# Patient Record
Sex: Female | Born: 1980 | Race: White | Hispanic: No | Marital: Single | Smoking: Current every day smoker
Health system: Southern US, Community
[De-identification: ages and names within clinical notes are randomized; demographics above are authoritative.]

## PROBLEM LIST (undated history)

## (undated) DIAGNOSIS — F319 Bipolar disorder, unspecified: Secondary | ICD-10-CM

## (undated) DIAGNOSIS — M810 Age-related osteoporosis without current pathological fracture: Secondary | ICD-10-CM

## (undated) DIAGNOSIS — N809 Endometriosis, unspecified: Secondary | ICD-10-CM

## (undated) DIAGNOSIS — R51 Headache: Secondary | ICD-10-CM

## (undated) DIAGNOSIS — I341 Nonrheumatic mitral (valve) prolapse: Secondary | ICD-10-CM

## (undated) DIAGNOSIS — J342 Deviated nasal septum: Secondary | ICD-10-CM

## (undated) DIAGNOSIS — R519 Headache, unspecified: Secondary | ICD-10-CM

## (undated) DIAGNOSIS — F840 Autistic disorder: Secondary | ICD-10-CM

## (undated) DIAGNOSIS — H6980 Other specified disorders of Eustachian tube, unspecified ear: Secondary | ICD-10-CM

## (undated) DIAGNOSIS — N3010 Interstitial cystitis (chronic) without hematuria: Secondary | ICD-10-CM

## (undated) DIAGNOSIS — S0300XA Dislocation of jaw, unspecified side, initial encounter: Secondary | ICD-10-CM

## (undated) DIAGNOSIS — H6990 Unspecified Eustachian tube disorder, unspecified ear: Secondary | ICD-10-CM

## (undated) DIAGNOSIS — J310 Chronic rhinitis: Secondary | ICD-10-CM

## (undated) DIAGNOSIS — F419 Anxiety disorder, unspecified: Secondary | ICD-10-CM

## (undated) HISTORY — DX: Chronic rhinitis: J31.0

## (undated) HISTORY — DX: Dislocation of jaw, unspecified side, initial encounter: S03.00XA

## (undated) HISTORY — DX: Headache: R51

## (undated) HISTORY — PX: CHOLECYSTECTOMY: SHX55

## (undated) HISTORY — DX: Headache, unspecified: R51.9

## (undated) HISTORY — DX: Autistic disorder: F84.0

## (undated) HISTORY — DX: Anxiety disorder, unspecified: F41.9

## (undated) HISTORY — DX: Bipolar disorder, unspecified: F31.9

## (undated) HISTORY — DX: Other specified disorders of Eustachian tube, unspecified ear: H69.80

## (undated) HISTORY — PX: WISDOM TOOTH EXTRACTION: SHX21

## (undated) HISTORY — DX: Nonrheumatic mitral (valve) prolapse: I34.1

## (undated) HISTORY — DX: Deviated nasal septum: J34.2

## (undated) HISTORY — PX: LAPAROSCOPIC ABDOMINAL EXPLORATION: SHX6249

## (undated) HISTORY — DX: Endometriosis, unspecified: N80.9

## (undated) HISTORY — DX: Unspecified eustachian tube disorder, unspecified ear: H69.90

---

## 2012-08-05 DIAGNOSIS — IMO0002 Reserved for concepts with insufficient information to code with codable children: Secondary | ICD-10-CM | POA: Insufficient documentation

## 2012-08-05 DIAGNOSIS — N803 Endometriosis of pelvic peritoneum: Secondary | ICD-10-CM | POA: Insufficient documentation

## 2014-02-12 DIAGNOSIS — R259 Unspecified abnormal involuntary movements: Secondary | ICD-10-CM | POA: Insufficient documentation

## 2014-02-12 DIAGNOSIS — F09 Unspecified mental disorder due to known physiological condition: Secondary | ICD-10-CM | POA: Insufficient documentation

## 2014-02-12 DIAGNOSIS — G43009 Migraine without aura, not intractable, without status migrainosus: Secondary | ICD-10-CM | POA: Insufficient documentation

## 2014-03-19 DIAGNOSIS — R6889 Other general symptoms and signs: Secondary | ICD-10-CM | POA: Insufficient documentation

## 2014-03-19 DIAGNOSIS — G479 Sleep disorder, unspecified: Secondary | ICD-10-CM | POA: Insufficient documentation

## 2014-03-19 DIAGNOSIS — F329 Major depressive disorder, single episode, unspecified: Secondary | ICD-10-CM | POA: Insufficient documentation

## 2014-03-19 DIAGNOSIS — R519 Headache, unspecified: Secondary | ICD-10-CM | POA: Insufficient documentation

## 2014-03-19 DIAGNOSIS — R413 Other amnesia: Secondary | ICD-10-CM | POA: Insufficient documentation

## 2014-03-19 DIAGNOSIS — K929 Disease of digestive system, unspecified: Secondary | ICD-10-CM | POA: Insufficient documentation

## 2014-03-19 DIAGNOSIS — F32A Depression, unspecified: Secondary | ICD-10-CM | POA: Insufficient documentation

## 2014-03-19 DIAGNOSIS — R51 Headache: Secondary | ICD-10-CM

## 2015-05-04 ENCOUNTER — Ambulatory Visit (INDEPENDENT_AMBULATORY_CARE_PROVIDER_SITE_OTHER): Payer: BLUE CROSS/BLUE SHIELD | Admitting: Neurology

## 2015-05-04 ENCOUNTER — Encounter: Payer: Self-pay | Admitting: Neurology

## 2015-05-04 VITALS — BP 96/52 | HR 52 | Resp 12 | Ht 61.0 in | Wt 105.0 lb

## 2015-05-04 DIAGNOSIS — R251 Tremor, unspecified: Secondary | ICD-10-CM

## 2015-05-04 DIAGNOSIS — F39 Unspecified mood [affective] disorder: Secondary | ICD-10-CM | POA: Diagnosis not present

## 2015-05-04 DIAGNOSIS — G43019 Migraine without aura, intractable, without status migrainosus: Secondary | ICD-10-CM

## 2015-05-04 MED ORDER — GABAPENTIN 100 MG PO CAPS: ORAL_CAPSULE | ORAL | Status: AC

## 2015-05-04 MED ORDER — ONDANSETRON HCL 4 MG PO TABS: 4.0000 mg | ORAL_TABLET | Freq: Three times a day (TID) | ORAL | Status: DC | PRN

## 2015-05-04 NOTE — Patient Instructions (Signed)
Please remember, common headache triggers are: sleep deprivation, dehydration, overheating, stress, hypoglycemia or skipping meals and blood sugar fluctuations, excessive pain medications or excessive alcohol use or caffeine withdrawal. Some people have food triggers such as aged cheese, orange juice or chocolate, especially dark chocolate, or MSG (monosodium glutamate). Try to avoid these headache triggers as much possible. It may be helpful to keep a headache diary to figure out what makes your headaches worse or brings them on and what alleviates them. Some people report headache onset after exercise but studies have shown that regular exercise may actually prevent headaches from coming. If you have exercise-induced headaches, please make sure that you drink plenty of fluid before and after exercising and that you do not over do it and do not overheat.  I would like for you try something on a daily basis to help prevent migraines from coming: Neurontin (gabapentin) 100 mg strength: Take 1 pill nightly at bedtime for 1 week, then 2 pills nightly for 1 week, then 3 pills each night thereafter. The most common side effects reported are sedation or sleepiness. Rare side effects include balance problems, confusion.   We will avoid triptans, such as sumatriptan or Relpax.   I will recommend, that Dr. Sudie BaileyProchnau reduce your metoprolol, due to low pulse and low blood pressure.   I will prescribe an as needed medication for nausea: Zofran (ondansetron), please be aware, it can make you sleepy or drowsy!

## 2015-05-04 NOTE — Progress Notes (Signed)
Subjective:    [name] ID: Kara [name] is a 34 y.o. female.  HPI     Kara Foley, MD, PhD [facility] [address] [address] [place], [place] [address]  Dear Dr. Sudie Bailey,   I saw your [name], Kara [name], upon your kind request in my neurologic clinic today for initial consultation of Kara [name] recurrent headaches, concern for migraines. Kara [name] is unaccompanied today. As you know, Kara [name] is a 34 year old right-handed woman with an underlying medical history of autism spectrum disorder, anxiety, bipolar disorder, mitral valve prolapse, tremors, and chronic rhinitis, who has had migraines for years, since she was a teenager.  She has previously been seeing Dr. Adella Hare in neurology. She is currently on clonazepam 1 mg twice daily, Relpax 40 mg as needed, MiraLAX twice daily, once daily multivitamin, fish well, Prozac 40 mg once daily, metoprolol 25-50 mg once daily, lithium 1200 mg twice daily, vitamin B6 twice daily and probiotic capsule once daily. She had a head CT without contrast on 12/28/2013: Study was reported as normal. This was done at [facility]. She had MRI with and without contrast on 02/18/2014 also at [facility] which showed nonspecific periventricular white matter signal abnormality, not clearly characteristic for any one disease process. 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 I reviewed your office note from  03/30/2015. She has been on metoprolol 50 mg once daily. She occasionally feels lightheaded. In Kara past, she may have tried Topamax but is not sure. She is not sure if she tried amitriptyline. For abortive treatment she tried sumatriptan but had side effects including chest tightness. More recently she tried Relpax last month felt Kara [name] migraines forgetting more frequent. She typically has a few migraine headaches per month. In Kara past 2 years she has experienced prolonged migraines  as well including migraines lasting for 4 days. She does not have a family history of migraines. She sees a Therapist, sports in [employer] as well as a Veterinary surgeon. 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 She does not work. She lives with Kara [name] parents. She has no children. Kara headache typically starts on Kara left side and is described as throbbing. She also has pain behind Kara left eye and this travels to Kara right eye sometimes. She has been having more associated nausea recently. She does get photophobia with Kara [name] headaches.  Kara [name] Past Medical History Is Significant For: Past Medical History  Diagnosis Date  . Mitral valve prolapse   . TMJ (dislocation of temporomandibular joint)   . Eustachian tube dysfunction   . Headache   . Chronic rhinitis   . Anxiety   . Autism spectrum disorder   . Bipolar disorder   . Deviated septum   . Endometriosis     Kara [name] Past Surgical History Is Significant For: Past Surgical History  Procedure Laterality Date  . Cholecystectomy    . Wisdom tooth extraction    . Laparoscopic abdominal exploration      Kara [name] Family History Is Significant For: Family History  Problem Relation Age of Onset  . Hypothyroidism Mother   . GER disease Father   . OCD Father   . Brain cancer Maternal Grandmother   . Hypertension Maternal Grandfather   . Stroke Maternal Grandfather   . Lung cancer Paternal Grandfather     Kara [name] Social History Is Significant For: History   Social History  . Marital Status: Single    Spouse Name:  N/A  . Number of Children: 0  . Years of Education: BA   Occupational History  . N/A    Social History Main Topics  . Smoking status: Former Games developer  . Smokeless tobacco: Not on file     Comment: Quit 2008  . Alcohol Use: No     Comment: Quit 5 years ago   . Drug Use: No     Comment: Quit over 7 yrs ago   . Sexual Activity: Not on file   Other Topics Concern  . None   Social History Narrative   Drinks 1 cup of tea in Kara  morning     Kara [name] Allergies Are:  Allergies  Allergen Reactions  . Sulfa Antibiotics   :   Kara [name] Current Medications Are:  Outpatient Encounter Prescriptions as of 05/04/2015  Medication Sig  . clonazePAM (KLONOPIN) 1 MG tablet Take 1 mg by mouth 2 (two) times daily.  Marland Kitchen FLUoxetine (PROZAC) 40 MG capsule Take 40 mg by mouth daily.  Marland Kitchen LITHIUM PO Take 1,200 mg by mouth 2 (two) times daily.  . metoprolol (LOPRESSOR) 50 MG tablet Take 50 mg by mouth 2 (two) times daily.  . norethindrone-ethinyl estradiol (MICROGESTIN,JUNEL,LOESTRIN) 1-20 MG-MCG tablet Take 1 tablet by mouth daily.  Marland Kitchen gabapentin (NEURONTIN) 100 MG capsule Take 1 pill nightly at bedtime for 1 week, then 2 pills nightly for 1 week, then 3 pills each night thereafter.  . ondansetron (ZOFRAN) 4 MG tablet Take 1 tablet (4 mg total) by mouth every 8 (eight) hours as needed for nausea or vomiting.  . [DISCONTINUED] ammonium lactate (AMLACTIN) 12 % cream Apply topically as needed for dry skin.  . [DISCONTINUED] clobetasol cream (TEMOVATE) 0.05 % Apply 1 application topically 2 (two) times daily.  . [DISCONTINUED] eletriptan (RELPAX) 40 MG tablet Take 40 mg by mouth as needed for migraine or headache. May repeat in 2 hours if headache persists or recurs.  . [DISCONTINUED] Multiple Vitamin (MULTIVITAMIN) tablet Take 1 tablet by mouth daily.  . [DISCONTINUED] Omega-3 Fatty Acids (FISH OIL) 1000 MG CAPS Take by mouth.  . [DISCONTINUED] pramipexole (MIRAPEX) 1 MG tablet Take 1 mg by mouth 3 (three) times daily.  . [DISCONTINUED] Probiotic Product (PROBIOTIC DAILY PO) Take by mouth.  . [DISCONTINUED] Pyridoxine HCl (VITAMIN B-6) 250 MG tablet Take 250 mg by mouth daily.   No facility-administered encounter medications on file as of 05/04/2015.  :  Review of Systems:  Out of a complete 14 point review of systems, all are reviewed and negative with Kara exception of these symptoms as listed below:   Review of Systems  HENT: Positive for  rhinorrhea and tinnitus.   Allergic/Immunologic: Positive for environmental allergies.  Neurological: Positive for tremors and headaches.       Short term memory loss, daytime sleepiness, wakes up during Kara night, wakes up in Kara morning feeling tired, takes naps during Kara day.   First migraine was in elementary school, last month had 10 migraines, this month she has had none. Migraines can last 4 days in a row.   Psychiatric/Behavioral:       Depression, anxiety, too much sleep, disinterest in activities    Objective:  Neurologic Exam  Physical Exam Physical Examination:   Filed Vitals:   05/04/15 1426  BP: 96/52  Pulse: 52  Resp: 12   General Examination: Kara [name] is a very pleasant 34 y.o. female in no acute distress. She is thin and underweight appearing. She is well groomed. She  feels mildly lightheaded. She is mildly anxious appearing.  HEENT: Normocephalic, atraumatic, pupils are equal, round and reactive to light and accommodation. Funduscopic exam is normal with sharp disc margins noted. Extraocular tracking is good without limitation to gaze excursion or nystagmus noted. Normal smooth pursuit is noted. Hearing is grossly intact. Tympanic membranes are clear bilaterally. Face is symmetric with normal facial animation and normal facial sensation. Speech is clear with no dysarthria noted. There is no hypophonia. There is no lip, neck/head, jaw or voice tremor. Neck is supple with full range of passive and active motion. There are no carotid bruits on auscultation. Oropharynx exam reveals: moderate mouth dryness, adequate dental hygiene and mild airway crowding, due to narrow airway entry. Mallampati is class I. Tongue protrudes centrally and palate elevates symmetrically. Tonsils are small. Neck size is slender.   Chest: Clear to auscultation without wheezing, rhonchi or crackles noted.  Heart: S1+S2+0, regular and normal without murmurs, rubs or gallops noted.   Abdomen:  Soft, non-tender and non-distended with normal bowel sounds appreciated on auscultation.  Extremities: There is no pitting edema in Kara distal lower extremities bilaterally. Pedal pulses are intact.  Skin: Warm and dry without trophic changes noted. There are no varicose veins.  Musculoskeletal: exam reveals no obvious joint deformities, tenderness or joint swelling or erythema.   Neurologically:  Mental status: Kara [name] is awake, alert and oriented in all 4 spheres. Kara [name] immediate and remote memory, attention, language skills and fund of knowledge are appropriate. There is evidence of aphasia, agnosia, apraxia or anomia. Speech is clear with normal prosody and enunciation. Thought process is linear. Mood is constricted and decreased range and affect is blunted.  Cranial nerves II - XII are as described above under HEENT exam. In addition: shoulder shrug is normal with equal shoulder height noted. Motor exam: Normal bulk, strength and tone is noted. There is no drift, or rebound. She has a mild lateral upper extremity postural and action tremor. Kara tremor frequency is fairly fast and amplitude is low. She has a slight resting tremor which is intermittent in both upper extremities. Romberg is negative. Reflexes are 2+ throughout. Babinski: Toes are flexor bilaterally. Fine motor skills and coordination: intact with normal finger taps, normal hand movements, normal rapid alternating patting, normal foot taps and normal foot agility.  Cerebellar testing: No dysmetria or intention tremor on finger to nose testing. Heel to shin is unremarkable bilaterally. There is no truncal or gait ataxia.  Sensory exam: intact to light touch, pinprick, vibration, temperature sense in Kara upper and lower extremities.  Gait, station and balance: She stands easily. No veering to one side is noted. No leaning to one side is noted. Posture is age-appropriate and stance is narrow based. Gait shows normal stride length and  normal pace. No problems turning are noted. She turns en bloc. Tandem walk is slightly difficult for Kara [name].  Assessment and Plan:   In summary, Kara [name] is a very pleasant 34 y.o.-year old female with an underlying medical history of autism spectrum disorder, anxiety, bipolar disorder, mitral valve prolapse, tremors, and chronic rhinitis, who has had migraines for years, since she was a teenager. For abortive treatment she tried 2 different triptans had side effects. For preventative treatment she has been on metoprolol but Kara [name] blood pressure and pulse a low today. In fact, I would recommend that Kara [name] metoprolol be reduced from 50 to 25 mg. For symptomatic treatment of Kara [name] nausea I suggested trial of Zofran. I talked  Kara [name] about potential side effects including sedation. She has evidence of mild upper extremity tremors, most likely secondary to Kara [name] lithium. She is currently on multiple psych a tropic medications. I talked Kara [name] about preventative migraine treatment as well. Kara challenges that she is already on sedating medications, she is already on an anti-suppressant, she may have tried Topamax in Kara past but Kara [name] weight is too low for me to consider this at this time. A beta blocker has been tried and has reduced Kara [name] blood pressure and pulse. A calcium channel blocker may have a similar effect. Today I suggested we try Kara [name] on low-dose gabapentin, starting at 100 mg strength with gradual titration. I asked Kara [name] to started at 100 mg each night for 1 week and increase it in 100 mg increments every week to up to 300 mg each night. I talked to Kara [name] about potential side effects. I provided Kara [name] with a new prescription and instructions. I also provided Kara [name] with a prescription for Zofran 4 mg for as needed use. We will try to avoid a triptan at this time.  I advised Kara [name] about common headache triggers: sleep deprivation, dehydration, overheating, stress, hypoglycemia or skipping meals and blood sugar  fluctuations, excessive pain medications or excessive alcohol use or caffeine withdrawal. Some people have food triggers such as aged cheese, orange juice or chocolate, especially dark chocolate, or MSG (monosodium glutamate). She is to try to avoid these headache triggers as much possible. It may be helpful to keep a headache diary to figure out what makes Kara [name] headaches worse or brings them on and what alleviates them. Some people report headache onset after exercise but studies have shown that regular exercise may actually prevent headaches from coming. If She has exercise-induced headaches, She is advised to drink plenty of fluid before and after exercising and that to not overdo it and to not overheat.  As far as further diagnostic testing is concerned, I suggested no new test today. She had a head CT as well as a brain MRI in 2015 which I reviewed Kara report. Kara [name] physical exam is otherwise nonfocal. She is reassured in that regard. I will see Kara [name] back routinely in 3-4 months, sooner if Kara need arises. I answered all Kara [name] questions today and Kara [name] was in agreement. I encouraged Kara [name] to call or email with any interim questions or concerns.  Thank you very much for allowing me to participate in Kara care of this nice [name]. If I can be of any further assistance to you please do not hesitate to call me at [phone].  Sincerely,   Kara Foley, MD, PhD

## 2015-08-30 ENCOUNTER — Other Ambulatory Visit: Payer: Self-pay

## 2015-08-30 DIAGNOSIS — G43019 Migraine without aura, intractable, without status migrainosus: Secondary | ICD-10-CM

## 2015-08-30 MED ORDER — ONDANSETRON HCL 4 MG PO TABS: 4.0000 mg | ORAL_TABLET | Freq: Three times a day (TID) | ORAL | Status: AC | PRN

## 2015-09-05 ENCOUNTER — Ambulatory Visit: Payer: Self-pay | Admitting: Neurology

## 2015-09-11 DIAGNOSIS — R358 Other polyuria: Secondary | ICD-10-CM | POA: Insufficient documentation

## 2015-09-11 DIAGNOSIS — F3181 Bipolar II disorder: Secondary | ICD-10-CM | POA: Insufficient documentation

## 2015-09-11 DIAGNOSIS — R3589 Other polyuria: Secondary | ICD-10-CM | POA: Insufficient documentation

## 2015-09-11 DIAGNOSIS — G723 Periodic paralysis: Secondary | ICD-10-CM | POA: Insufficient documentation

## 2015-09-11 DIAGNOSIS — T43595A Adverse effect of other antipsychotics and neuroleptics, initial encounter: Secondary | ICD-10-CM | POA: Insufficient documentation

## 2015-09-21 ENCOUNTER — Telehealth: Payer: Self-pay | Admitting: Neurology

## 2015-09-21 NOTE — Telephone Encounter (Signed)
Pt's mother called stating pt was hospitalized a few wks ago and the gabapentin was discontinued. She was diagnosed with nephrogenic diabetes and insipidus lithium induced. Mother states pt was started on gabapentin week from PCP and PCP is thinking pt should wait to see Dr Frances FurbishAthar until she has been on medication for awhile. Mother will call back in a few months to r/s appt.

## 2015-09-21 NOTE — Telephone Encounter (Signed)
FYI

## 2015-09-27 DIAGNOSIS — E871 Hypo-osmolality and hyponatremia: Secondary | ICD-10-CM | POA: Insufficient documentation

## 2015-10-03 ENCOUNTER — Ambulatory Visit: Payer: BLUE CROSS/BLUE SHIELD | Admitting: Neurology

## 2015-11-17 DIAGNOSIS — M419 Scoliosis, unspecified: Secondary | ICD-10-CM | POA: Insufficient documentation

## 2015-11-17 DIAGNOSIS — F840 Autistic disorder: Secondary | ICD-10-CM | POA: Insufficient documentation

## 2015-11-17 DIAGNOSIS — F411 Generalized anxiety disorder: Secondary | ICD-10-CM | POA: Insufficient documentation

## 2015-11-17 DIAGNOSIS — F3181 Bipolar II disorder: Secondary | ICD-10-CM | POA: Insufficient documentation

## 2016-01-26 DIAGNOSIS — Z0279 Encounter for issue of other medical certificate: Secondary | ICD-10-CM

## 2016-05-23 ENCOUNTER — Ambulatory Visit (INDEPENDENT_AMBULATORY_CARE_PROVIDER_SITE_OTHER): Payer: BLUE CROSS/BLUE SHIELD | Admitting: Sports Medicine

## 2016-05-23 ENCOUNTER — Encounter: Payer: Self-pay | Admitting: Sports Medicine

## 2016-05-23 DIAGNOSIS — L03032 Cellulitis of left toe: Secondary | ICD-10-CM

## 2016-05-23 DIAGNOSIS — L03012 Cellulitis of left finger: Secondary | ICD-10-CM

## 2016-05-23 DIAGNOSIS — L603 Nail dystrophy: Secondary | ICD-10-CM

## 2016-05-23 DIAGNOSIS — M79675 Pain in left toe(s): Secondary | ICD-10-CM | POA: Diagnosis not present

## 2016-05-23 NOTE — Patient Instructions (Signed)

## 2016-05-23 NOTE — Progress Notes (Signed)
Patient ID: Alis Sawchuk Vanhoesen, female   DOB: 04-Oct-1981, 35 y.o.   MRN: [id number] Subjective: Kara [name] is a 35 y.o. female patient presents to office today complaining of a painful incurvated, red, hot, swollen lateral nail border of the first toe on the left foot. This has been present for several years on and off. Patient has treated this by trimming, Epsom salt, and currently on antibiotics started by her primary care doctor and fluconazole for concerning of fungus/nail changes. Patient denies fever/chills/nausea/vomitting/any other related constitutional symptoms at this time.  Patient Active Problem List   Diagnosis Date Noted  . Autism spectrum 11/17/2015  . Bipolar II disorder, most recent episode major depressive (HCC) 11/17/2015  . Anxiety, generalized 11/17/2015  . Scoliosis 11/17/2015  . Hyponatremia with decreased serum osmolality 09/27/2015  . Bipolar II disorder (HCC) 09/11/2015  . Adynamia 09/11/2015  . Adverse effect of other antipsychotics and neuroleptics, initial encounter 09/11/2015  . Diuresis excessive 09/11/2015  . Clinical depression 03/19/2014  . Disease of stomach and intestines 03/19/2014  . Cephalalgia 03/19/2014  . Amnesia 03/19/2014  . Abnormal findings-skull or head 03/19/2014  . Difficulty sleeping 03/19/2014  . Abnormal involuntary movement 02/12/2014  . Atypical migraine 02/12/2014  . Mild cognitive disorder 02/12/2014  . Cul-de-sac Riley Lam') endometriosis 08/05/2012    Current Outpatient Prescriptions on File Prior to Visit  Medication Sig Dispense Refill  . clonazePAM (KLONOPIN) 1 MG tablet Take 1 mg by mouth 2 (two) times daily.    Marland Kitchen FLUoxetine (PROZAC) 40 MG capsule Take 40 mg by mouth daily.    Marland Kitchen gabapentin (NEURONTIN) 100 MG capsule Take 1 pill nightly at bedtime for 1 week, then 2 pills nightly for 1 week, then 3 pills each night thereafter. 90 capsule 5  . norethindrone-ethinyl estradiol (MICROGESTIN,JUNEL,LOESTRIN) 1-20 MG-MCG tablet  Take 1 tablet by mouth daily.    . ondansetron (ZOFRAN) 4 MG tablet Take 1 tablet (4 mg total) by mouth every 8 (eight) hours as needed for nausea or vomiting. 20 tablet 0   No current facility-administered medications on file prior to visit.    Allergies  Allergen Reactions  . Sulfa Antibiotics Hives  . Black Walnut Pollen Allergy Skin Test Other (See Comments)    Throat closes up    Objective:  There were no vitals filed for this visit.  General: Well developed, nourished, in no acute distress, alert and oriented x3   Dermatology: Skin is warm, dry and supple bilateral. Left hallux nail appears to be  severely incurvated with hyperkeratosis formation at the distal aspects of  the lateral nail border. There is significant thickening of the left great toenail. (+) Erythema. (-) Edema. (-) serosanguous drainage present. The remaining nails appear unremarkable, free from ingrowing or symptoms at this time. There are no open sores, lesions or other signs of infection present.  Vascular: Dorsalis Pedis artery and Posterior Tibial artery pedal pulses are 2/4 bilateral with immedate capillary fill time. Pedal hair growth present. No lower extremity edema.   Neruologic: Grossly intact via light touch bilateral.  Musculoskeletal: Tenderness to palpation of the left hallux lateral nail fold. Muscular strength within normal limits in all groups bilateral.   Assesement and Plan: Problem List Items Addressed This Visit    None    Visit Diagnoses    Paronychia, left    -  Primary    Relevant Medications    fluconazole (DIFLUCAN) 150 MG tablet    cephALEXin (KEFLEX) 500 MG capsule  Toe pain, left        Nail dystrophy           -Discussed treatment alternatives and plan of care; Explained permanent/temporary nail avulsion and post procedure course to patient.Patient opt for total temporary removal of left hallux nail.  - After a verbal consent, injected 3 ml of a 50:50 mixture of 2%  plain  lidocaine and 0.5% plain marcaine in a normal hallux block fashion. Next, a  betadine prep was performed. Anesthesia was tested and found to be appropriate.  The left hallux nail was then incised from the hyponychium to the epinychium. The nail was removed and cleared from the field. The area was curretted for any remaining nail or spicules then flushed with alcohol and dressed with antibiotic cream and a dry sterile dressing. -Patient was instructed to leave the dressing intact for today and begin soaking  in a weak solution of betadine and water tomorrow. Patient was instructed to  soak for 15 minutes each day and apply neosporin and a gauze or bandaid dressing each day. -Patient was instructed to monitor the toe for signs of infection and return to office if toe becomes red, hot or swollen. -Advised ice, elevation, and tylenol or motrin if needed for pain.  -Continue with Keflex until completed as given by PCP -Continue with fluconazole as given by primary care doctor. Advised patient that we will see how her new nail grows in over time. Patient may warrant further treatment for nail changes, Based on growth of new nail however,  Advised patient that must wait adequate time of 6 months to a year to determine how nail will grow in and to further determine if additional treatment is warranted. -Work note given: No work 3 days. 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  -Patient is to return in 1 week for follow up care/nail check or sooner if problems arise.  Asencion Islamitorya Cylas Falzone, DPM

## 2016-05-30 ENCOUNTER — Encounter: Payer: Self-pay | Admitting: Sports Medicine

## 2016-05-30 ENCOUNTER — Ambulatory Visit (INDEPENDENT_AMBULATORY_CARE_PROVIDER_SITE_OTHER): Payer: BLUE CROSS/BLUE SHIELD | Admitting: Sports Medicine

## 2016-05-30 DIAGNOSIS — Z9889 Other specified postprocedural states: Secondary | ICD-10-CM

## 2016-05-30 DIAGNOSIS — M79675 Pain in left toe(s): Secondary | ICD-10-CM

## 2016-05-30 NOTE — Progress Notes (Signed)
Patient ID: Kara PitterSusannah J [name], female   DOB: [birth year], 35 y.o.   MRN: [id number] Subjective: Kara [name] is a 35 y.o. female patient returns to office today for follow up evaluation after having Left Hallux total temporary nail avulsion performed on 05/23/2016 for paronychia and nail dystrophy. Patient has been soaking using betadine and applying topical antibiotic covered with bandaid daily. Patient has completed antibiotics with no problems and also finish her last dose of Diflucan which she was prescribed 12 weeks by her primary care doctor Patient denies fever/chills/nausea/vomitting/any other related constitutional symptoms at this time.  Patient Active Problem List   Diagnosis Date Noted  . Autism spectrum 11/17/2015  . Bipolar II disorder, most recent episode major depressive (HCC) 11/17/2015  . Anxiety, generalized 11/17/2015  . Scoliosis 11/17/2015  . Hyponatremia with decreased serum osmolality 09/27/2015  . Bipolar II disorder (HCC) 09/11/2015  . Adynamia 09/11/2015  . Adverse effect of other antipsychotics and neuroleptics, initial encounter 09/11/2015  . Diuresis excessive 09/11/2015  . Clinical depression 03/19/2014  . Disease of stomach and intestines 03/19/2014  . Cephalalgia 03/19/2014  . Amnesia 03/19/2014  . Abnormal findings-skull or head 03/19/2014  . Difficulty sleeping 03/19/2014  . Abnormal involuntary movement 02/12/2014  . Atypical migraine 02/12/2014  . Mild cognitive disorder 02/12/2014  . 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 ammonium lactate (LAC-HYDRIN) 12 % lotion apply to affected area twice a day FOR DRY SKIN  0  . cephALEXin (KEFLEX) 500 MG capsule take 1 capsule by mouth three times a day for 10 days  0  . clonazePAM (KLONOPIN) 0.5 MG tablet   0  . clonazePAM (KLONOPIN) 1 MG tablet Take 1 mg by mouth 2 (two) times daily.    . fluconazole  (DIFLUCAN) 150 MG tablet take 1 tablet by mouth ONCE A WEEK FOR 12 WEEKS  0  . FLUoxetine (PROZAC) 40 MG capsule Take 40 mg by mouth daily.    Marland Kitchen. gabapentin (NEURONTIN) 100 MG capsule Take 1 pill nightly at bedtime for 1 week, then 2 pills nightly for 1 week, then 3 pills each night thereafter. 90 capsule 5  . norethindrone-ethinyl estradiol (MICROGESTIN,JUNEL,LOESTRIN) 1-20 MG-MCG tablet Take 1 tablet by mouth daily.    . ondansetron (ZOFRAN) 4 MG tablet Take 1 tablet (4 mg total) by mouth every 8 (eight) hours as needed for nausea or vomiting. 20 tablet 0  . OXcarbazepine (TRILEPTAL) 150 MG tablet Take 300 mg by mouth at bedtime.  0   No current facility-administered medications on file prior to visit.    Allergies  Allergen Reactions  . Sulfa Antibiotics Hives  . Black Walnut Pollen Allergy Skin Test Other (See Comments)    Throat closes up    Objective:  General: Well developed, nourished, in no acute distress, alert and oriented x3   Dermatology: Skin is warm, dry and supple bilateral.Left hallux nail bed appears to be clean, dry, with mild granular tissue and surrounding eschar/scab. (-) Erythema. (-) Edema. (-) serosanguous drainage present. The remaining nails appear unremarkable at this time. There are no other lesions or other signs of infection present.  Neurovascular status: Intact. No lower extremity swelling; No pain with calf compression bilateral.  Musculoskeletal: Decreased tenderness to palpation of the left hallux. Muscular strength within normal limits bilateral.   Assesement and Plan: Problem List Items Addressed This Visit    None    Visit Diagnoses  S/P nail surgery    -  Primary    Toe pain, left          -Examined patient  -CleansedLeft hallux nail bed and gently scrubbed with peroxide and applied antibiotic cream covered with bandaid.  -Discussed plan of care with patient. -Patient to now begin soaking in a weak solution of Epsom salt and warm water.  Patient was instructed to soak for 15-20 minutes each day until the toe appears normal and there is no drainage, redness, tenderness, or swelling at the procedure site, and apply neosporin and a gauze or bandaid dressing each day as needed. May leave open to air at night. -Educated patient on long term care after nail surgery. -Patient was instructed to monitor the toe for reoccurrence and signs of infection; Patient advised to return to office or go to ER if toe becomes red, hot or swollen. -Patient is to return as needed or sooner if problems arise.  Asencion Islam, DPM

## 2019-05-24 ENCOUNTER — Emergency Department (HOSPITAL_COMMUNITY)
Admission: EM | Admit: 2019-05-24 | Discharge: 2019-05-24 | Disposition: A | Discharge status: Home / Self Care | Payer: BLUE CROSS/BLUE SHIELD | Attending: Emergency Medicine | Admitting: Emergency Medicine

## 2019-05-24 ENCOUNTER — Encounter (HOSPITAL_COMMUNITY): Payer: Self-pay | Admitting: Emergency Medicine

## 2019-05-24 ENCOUNTER — Other Ambulatory Visit: Payer: Self-pay

## 2019-05-24 ENCOUNTER — Emergency Department (HOSPITAL_COMMUNITY): Payer: BLUE CROSS/BLUE SHIELD

## 2019-05-24 DIAGNOSIS — Y999 Unspecified external cause status: Secondary | ICD-10-CM | POA: Diagnosis not present

## 2019-05-24 DIAGNOSIS — Z79899 Other long term (current) drug therapy: Secondary | ICD-10-CM | POA: Insufficient documentation

## 2019-05-24 DIAGNOSIS — F840 Autistic disorder: Secondary | ICD-10-CM | POA: Insufficient documentation

## 2019-05-24 DIAGNOSIS — F17210 Nicotine dependence, cigarettes, uncomplicated: Secondary | ICD-10-CM | POA: Diagnosis not present

## 2019-05-24 DIAGNOSIS — F0781 Postconcussional syndrome: Secondary | ICD-10-CM | POA: Diagnosis not present

## 2019-05-24 DIAGNOSIS — G44309 Post-traumatic headache, unspecified, not intractable: Secondary | ICD-10-CM | POA: Diagnosis present

## 2019-05-24 DIAGNOSIS — Y939 Activity, unspecified: Secondary | ICD-10-CM | POA: Diagnosis not present

## 2019-05-24 DIAGNOSIS — Y92410 Unspecified street and highway as the place of occurrence of the external cause: Secondary | ICD-10-CM | POA: Diagnosis not present

## 2019-05-24 HISTORY — DX: Age-related osteoporosis without current pathological fracture: M81.0

## 2019-05-24 HISTORY — DX: Interstitial cystitis (chronic) without hematuria: N30.10
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
## 2019-05-24 NOTE — ED Notes (Signed)
Attempted to e sign and signature pad not working.
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
## 2019-10-30 ENCOUNTER — Other Ambulatory Visit: Payer: Self-pay

## 2019-10-30 DIAGNOSIS — Z20822 Contact with and (suspected) exposure to covid-19: Secondary | ICD-10-CM

## 2019-11-02 LAB — NOVEL CORONAVIRUS, NAA: SARS-CoV-2, NAA: NOT DETECTED
